# Patient Record
Sex: Female | Born: 1955 | Race: White | Hispanic: No
Health system: Southern US, Community
[De-identification: ages and names within clinical notes are randomized; demographics above are authoritative.]

---

## 1998-04-06 ENCOUNTER — Ambulatory Visit (HOSPITAL_COMMUNITY): Admission: RE | Admit: 1998-04-06 | Discharge: 1998-04-06 | Payer: Self-pay | Admitting: Obstetrics and Gynecology

## 1999-04-11 ENCOUNTER — Ambulatory Visit (HOSPITAL_COMMUNITY): Admission: RE | Admit: 1999-04-11 | Discharge: 1999-04-11 | Payer: Self-pay | Admitting: Obstetrics and Gynecology

## 1999-04-11 ENCOUNTER — Encounter: Payer: Self-pay | Admitting: Obstetrics and Gynecology

## 2000-04-18 ENCOUNTER — Encounter: Payer: Self-pay | Admitting: Obstetrics and Gynecology

## 2000-04-18 ENCOUNTER — Ambulatory Visit (HOSPITAL_COMMUNITY): Admission: RE | Admit: 2000-04-18 | Discharge: 2000-04-18 | Payer: Self-pay | Admitting: Obstetrics and Gynecology

## 2001-04-20 ENCOUNTER — Encounter: Payer: Self-pay | Admitting: Obstetrics and Gynecology

## 2001-04-20 ENCOUNTER — Ambulatory Visit (HOSPITAL_COMMUNITY): Admission: RE | Admit: 2001-04-20 | Discharge: 2001-04-20 | Payer: Self-pay | Admitting: Obstetrics and Gynecology

## 2002-07-22 ENCOUNTER — Encounter: Payer: Self-pay | Admitting: Obstetrics and Gynecology

## 2002-07-22 ENCOUNTER — Ambulatory Visit (HOSPITAL_COMMUNITY): Admission: RE | Admit: 2002-07-22 | Discharge: 2002-07-22 | Payer: Self-pay | Admitting: Obstetrics and Gynecology

## 2003-09-08 ENCOUNTER — Encounter: Payer: Self-pay | Admitting: Obstetrics and Gynecology

## 2003-09-08 ENCOUNTER — Ambulatory Visit (HOSPITAL_COMMUNITY): Admission: RE | Admit: 2003-09-08 | Discharge: 2003-09-08 | Payer: Self-pay | Admitting: Obstetrics and Gynecology

## 2004-10-22 ENCOUNTER — Ambulatory Visit (HOSPITAL_COMMUNITY): Admission: RE | Admit: 2004-10-22 | Discharge: 2004-10-22 | Payer: Self-pay | Admitting: Obstetrics and Gynecology

## 2005-11-19 ENCOUNTER — Ambulatory Visit (HOSPITAL_COMMUNITY): Admission: RE | Admit: 2005-11-19 | Discharge: 2005-11-19 | Payer: Self-pay | Admitting: Obstetrics and Gynecology

## 2006-11-20 ENCOUNTER — Ambulatory Visit (HOSPITAL_COMMUNITY): Admission: RE | Admit: 2006-11-20 | Discharge: 2006-11-20 | Payer: Self-pay | Admitting: Obstetrics and Gynecology

## 2007-11-25 ENCOUNTER — Ambulatory Visit (HOSPITAL_COMMUNITY): Admission: RE | Admit: 2007-11-25 | Discharge: 2007-11-25 | Payer: Self-pay | Admitting: Obstetrics and Gynecology

## 2008-11-25 ENCOUNTER — Ambulatory Visit (HOSPITAL_COMMUNITY): Admission: RE | Admit: 2008-11-25 | Discharge: 2008-11-25 | Payer: Self-pay | Admitting: Obstetrics and Gynecology

## 2009-11-27 ENCOUNTER — Ambulatory Visit (HOSPITAL_COMMUNITY): Admission: RE | Admit: 2009-11-27 | Discharge: 2009-11-27 | Payer: Self-pay | Admitting: Obstetrics and Gynecology

## 2010-11-29 ENCOUNTER — Ambulatory Visit (HOSPITAL_COMMUNITY)
Admission: RE | Admit: 2010-11-29 | Discharge: 2010-11-29 | Payer: Self-pay | Source: Home / Self Care | Attending: Obstetrics and Gynecology | Admitting: Obstetrics and Gynecology

## 2010-12-14 ENCOUNTER — Encounter
Admission: RE | Admit: 2010-12-14 | Discharge: 2010-12-14 | Payer: Self-pay | Source: Home / Self Care | Attending: Obstetrics and Gynecology | Admitting: Obstetrics and Gynecology

## 2011-11-25 ENCOUNTER — Other Ambulatory Visit (HOSPITAL_COMMUNITY): Payer: Self-pay | Admitting: Obstetrics and Gynecology

## 2011-11-25 DIAGNOSIS — Z1231 Encounter for screening mammogram for malignant neoplasm of breast: Secondary | ICD-10-CM

## 2011-12-30 ENCOUNTER — Ambulatory Visit (HOSPITAL_COMMUNITY): Payer: Self-pay

## 2012-01-28 ENCOUNTER — Ambulatory Visit (HOSPITAL_COMMUNITY)
Admission: RE | Admit: 2012-01-28 | Discharge: 2012-01-28 | Disposition: A | Discharge status: Home / Self Care | Payer: BC Managed Care – PPO | Source: Ambulatory Visit | Attending: Obstetrics and Gynecology | Admitting: Obstetrics and Gynecology

## 2012-01-28 DIAGNOSIS — Z1231 Encounter for screening mammogram for malignant neoplasm of breast: Secondary | ICD-10-CM

## 2012-12-22 ENCOUNTER — Other Ambulatory Visit (HOSPITAL_COMMUNITY): Payer: Self-pay | Admitting: Obstetrics and Gynecology

## 2012-12-22 DIAGNOSIS — Z1231 Encounter for screening mammogram for malignant neoplasm of breast: Secondary | ICD-10-CM

## 2013-01-28 ENCOUNTER — Ambulatory Visit (HOSPITAL_COMMUNITY): Payer: Managed Care, Other (non HMO)

## 2013-02-11 ENCOUNTER — Ambulatory Visit (HOSPITAL_COMMUNITY)
Admission: RE | Admit: 2013-02-11 | Discharge: 2013-02-11 | Disposition: A | Discharge status: Home / Self Care | Payer: BC Managed Care – PPO | Source: Ambulatory Visit | Attending: Obstetrics and Gynecology | Admitting: Obstetrics and Gynecology

## 2013-02-11 DIAGNOSIS — Z1231 Encounter for screening mammogram for malignant neoplasm of breast: Secondary | ICD-10-CM | POA: Insufficient documentation

## 2014-01-06 ENCOUNTER — Other Ambulatory Visit (HOSPITAL_COMMUNITY): Payer: Self-pay | Admitting: Obstetrics and Gynecology

## 2014-01-06 DIAGNOSIS — Z1231 Encounter for screening mammogram for malignant neoplasm of breast: Secondary | ICD-10-CM

## 2014-02-15 ENCOUNTER — Ambulatory Visit (HOSPITAL_COMMUNITY)
Admission: RE | Admit: 2014-02-15 | Discharge: 2014-02-15 | Disposition: A | Discharge status: Home / Self Care | Payer: BC Managed Care – PPO | Source: Ambulatory Visit | Attending: Obstetrics and Gynecology | Admitting: Obstetrics and Gynecology

## 2014-02-15 DIAGNOSIS — Z1231 Encounter for screening mammogram for malignant neoplasm of breast: Secondary | ICD-10-CM | POA: Insufficient documentation

## 2015-02-16 ENCOUNTER — Other Ambulatory Visit (HOSPITAL_COMMUNITY): Payer: Self-pay | Admitting: Obstetrics and Gynecology

## 2015-02-16 DIAGNOSIS — Z1231 Encounter for screening mammogram for malignant neoplasm of breast: Secondary | ICD-10-CM

## 2015-02-21 ENCOUNTER — Ambulatory Visit (HOSPITAL_COMMUNITY)
Admission: RE | Admit: 2015-02-21 | Discharge: 2015-02-21 | Disposition: A | Discharge status: Home / Self Care | Payer: BLUE CROSS/BLUE SHIELD | Source: Ambulatory Visit | Attending: Obstetrics and Gynecology | Admitting: Obstetrics and Gynecology

## 2015-02-21 DIAGNOSIS — Z1231 Encounter for screening mammogram for malignant neoplasm of breast: Secondary | ICD-10-CM | POA: Diagnosis not present

## 2016-02-23 ENCOUNTER — Other Ambulatory Visit: Payer: Self-pay

## 2016-02-23 DIAGNOSIS — Z1231 Encounter for screening mammogram for malignant neoplasm of breast: Secondary | ICD-10-CM

## 2016-03-07 ENCOUNTER — Ambulatory Visit
Admission: RE | Admit: 2016-03-07 | Discharge: 2016-03-07 | Disposition: A | Discharge status: Home / Self Care | Payer: BLUE CROSS/BLUE SHIELD | Source: Ambulatory Visit

## 2016-03-07 DIAGNOSIS — Z1231 Encounter for screening mammogram for malignant neoplasm of breast: Secondary | ICD-10-CM

## 2017-02-17 ENCOUNTER — Other Ambulatory Visit: Payer: Self-pay | Admitting: Obstetrics and Gynecology

## 2017-02-17 DIAGNOSIS — Z1231 Encounter for screening mammogram for malignant neoplasm of breast: Secondary | ICD-10-CM

## 2017-03-31 ENCOUNTER — Ambulatory Visit
Admission: RE | Admit: 2017-03-31 | Discharge: 2017-03-31 | Disposition: A | Discharge status: Home / Self Care | Payer: BLUE CROSS/BLUE SHIELD | Source: Ambulatory Visit | Attending: Obstetrics and Gynecology | Admitting: Obstetrics and Gynecology

## 2017-03-31 DIAGNOSIS — Z1231 Encounter for screening mammogram for malignant neoplasm of breast: Secondary | ICD-10-CM

## 2018-02-24 ENCOUNTER — Other Ambulatory Visit: Payer: Self-pay | Admitting: Obstetrics and Gynecology

## 2018-02-24 DIAGNOSIS — Z1231 Encounter for screening mammogram for malignant neoplasm of breast: Secondary | ICD-10-CM

## 2018-04-01 ENCOUNTER — Ambulatory Visit
Admission: RE | Admit: 2018-04-01 | Discharge: 2018-04-01 | Disposition: A | Discharge status: Home / Self Care | Payer: BLUE CROSS/BLUE SHIELD | Source: Ambulatory Visit | Attending: Obstetrics and Gynecology | Admitting: Obstetrics and Gynecology

## 2018-04-01 DIAGNOSIS — Z1231 Encounter for screening mammogram for malignant neoplasm of breast: Secondary | ICD-10-CM

## 2019-02-26 ENCOUNTER — Other Ambulatory Visit: Payer: Self-pay | Admitting: Obstetrics and Gynecology

## 2019-02-26 DIAGNOSIS — Z1231 Encounter for screening mammogram for malignant neoplasm of breast: Secondary | ICD-10-CM

## 2019-04-06 ENCOUNTER — Ambulatory Visit: Payer: BLUE CROSS/BLUE SHIELD

## 2019-05-25 ENCOUNTER — Ambulatory Visit
Admission: RE | Admit: 2019-05-25 | Discharge: 2019-05-25 | Disposition: A | Discharge status: Home / Self Care | Payer: BC Managed Care – PPO | Source: Ambulatory Visit | Attending: Obstetrics and Gynecology | Admitting: Obstetrics and Gynecology

## 2019-05-25 ENCOUNTER — Other Ambulatory Visit: Payer: Self-pay

## 2019-05-25 DIAGNOSIS — Z1231 Encounter for screening mammogram for malignant neoplasm of breast: Secondary | ICD-10-CM

## 2020-03-30 ENCOUNTER — Ambulatory Visit: Payer: Self-pay | Attending: Internal Medicine

## 2020-03-30 DIAGNOSIS — Z23 Encounter for immunization: Secondary | ICD-10-CM

## 2020-03-30 NOTE — Progress Notes (Signed)
   Covid-19 Vaccination Clinic  Name:  Brooke Hubbard    MRN: [id number] DOB: 26-May-1956  03/30/2020  [name] was observed post Covid-19 immunization for 15 minutes without incident. She was provided with Vaccine Information Sheet and instruction to access the V-Safe system.   [name] was instructed to call 911 with any severe reactions post vaccine: Marland Kitchen Difficulty breathing  . Swelling of face and throat  . A fast heartbeat  . A bad rash all over body  . Dizziness and weakness   Immunizations Administered    Name Date Dose VIS Date Route   Pfizer COVID-19 Vaccine 03/30/2020 10:50 AM 0.3 mL 11/26/2019 Intramuscular   Manufacturer: [employer], [employer]   Lot: [id number]   NDC: [address]-6

## 2020-04-17 ENCOUNTER — Other Ambulatory Visit: Payer: Self-pay | Admitting: Obstetrics and Gynecology

## 2020-04-17 DIAGNOSIS — Z1231 Encounter for screening mammogram for malignant neoplasm of breast: Secondary | ICD-10-CM

## 2020-04-26 ENCOUNTER — Ambulatory Visit: Payer: Medicare Other | Attending: Internal Medicine

## 2020-04-26 DIAGNOSIS — Z23 Encounter for immunization: Secondary | ICD-10-CM

## 2020-04-26 NOTE — Progress Notes (Signed)
   Covid-19 Vaccination Clinic  Name:  LIANNI KANAAN    MRN: [id number] DOB: 1956-01-14  04/26/2020  Ms. Burston was observed post Covid-19 immunization for 15 minutes without incident. She was provided with Vaccine Information Sheet and instruction to access the V-Safe system.   Ms. Haltiwanger was instructed to call 911 with any severe reactions post vaccine: Marland Kitchen Difficulty breathing  . Swelling of face and throat  . A fast heartbeat  . A bad rash all over body  . Dizziness and weakness   Immunizations Administered    Name Date Dose VIS Date Route   Pfizer COVID-19 Vaccine 04/26/2020  8:09 AM 0.3 mL 02/09/2019 Intramuscular   Manufacturer: [employer], [employer]   Lot: [id number]   NDC: [address]-1

## 2020-06-08 ENCOUNTER — Other Ambulatory Visit: Payer: Self-pay

## 2020-06-08 ENCOUNTER — Ambulatory Visit
Admission: RE | Admit: 2020-06-08 | Discharge: 2020-06-08 | Disposition: A | Discharge status: Home / Self Care | Payer: Medicare Other | Source: Ambulatory Visit | Attending: Obstetrics and Gynecology | Admitting: Obstetrics and Gynecology

## 2020-06-08 DIAGNOSIS — Z1231 Encounter for screening mammogram for malignant neoplasm of breast: Secondary | ICD-10-CM

## 2021-04-30 ENCOUNTER — Other Ambulatory Visit: Payer: Self-pay | Admitting: Student

## 2021-04-30 ENCOUNTER — Other Ambulatory Visit: Payer: Self-pay | Admitting: Family Medicine

## 2021-04-30 DIAGNOSIS — Z1231 Encounter for screening mammogram for malignant neoplasm of breast: Secondary | ICD-10-CM

## 2021-06-25 ENCOUNTER — Other Ambulatory Visit: Payer: Self-pay

## 2021-06-25 ENCOUNTER — Ambulatory Visit
Admission: RE | Admit: 2021-06-25 | Discharge: 2021-06-25 | Disposition: A | Discharge status: Home / Self Care | Payer: Medicare Other | Source: Ambulatory Visit | Attending: Family Medicine | Admitting: Family Medicine

## 2021-06-25 DIAGNOSIS — Z1231 Encounter for screening mammogram for malignant neoplasm of breast: Secondary | ICD-10-CM

## 2022-03-10 IMAGING — MG MM DIGITAL SCREENING BILAT W/ TOMO AND CAD
8 series · 8 of 24 positions shown · non-contrast
Comparison: Previous exam(s).

CLINICAL DATA: Screening.

EXAM:
DIGITAL SCREENING BILATERAL MAMMOGRAM WITH TOMOSYNTHESIS AND CAD
TECHNIQUE: Bilateral screening digital craniocaudal and mediolateral oblique
mammograms were obtained. Bilateral screening digital breast
tomosynthesis was performed. The images were evaluated with
computer-aided detection.

[R CC synth-2D]
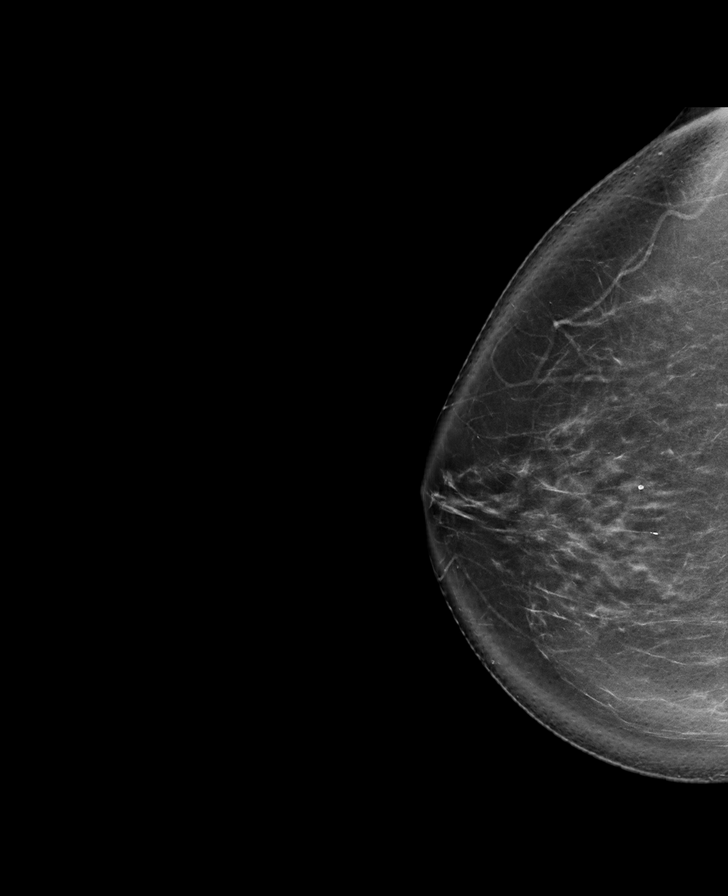

[L CC synth-2D]
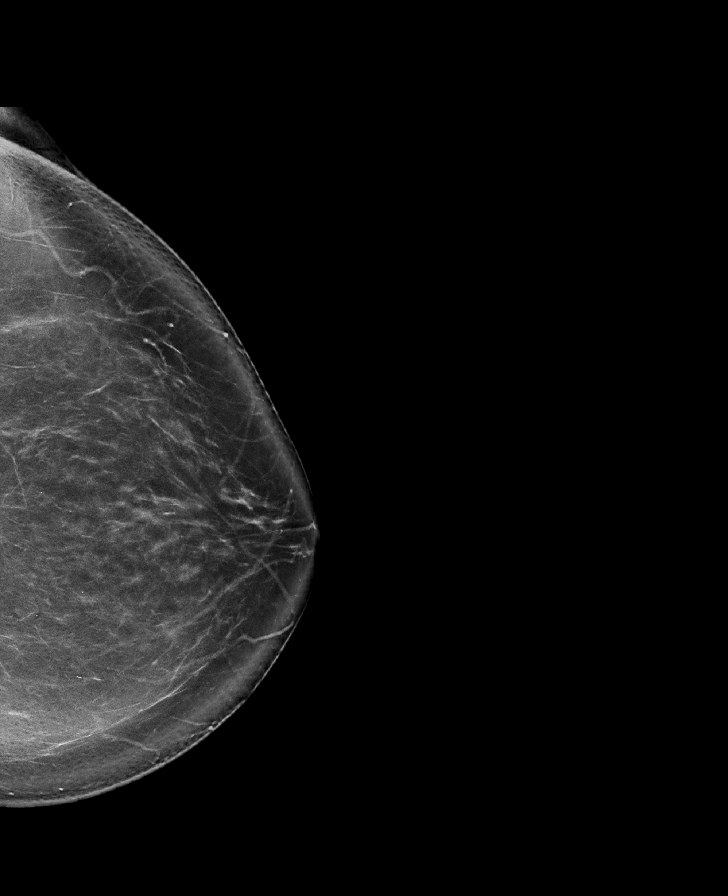

[R MLO synth-2D]
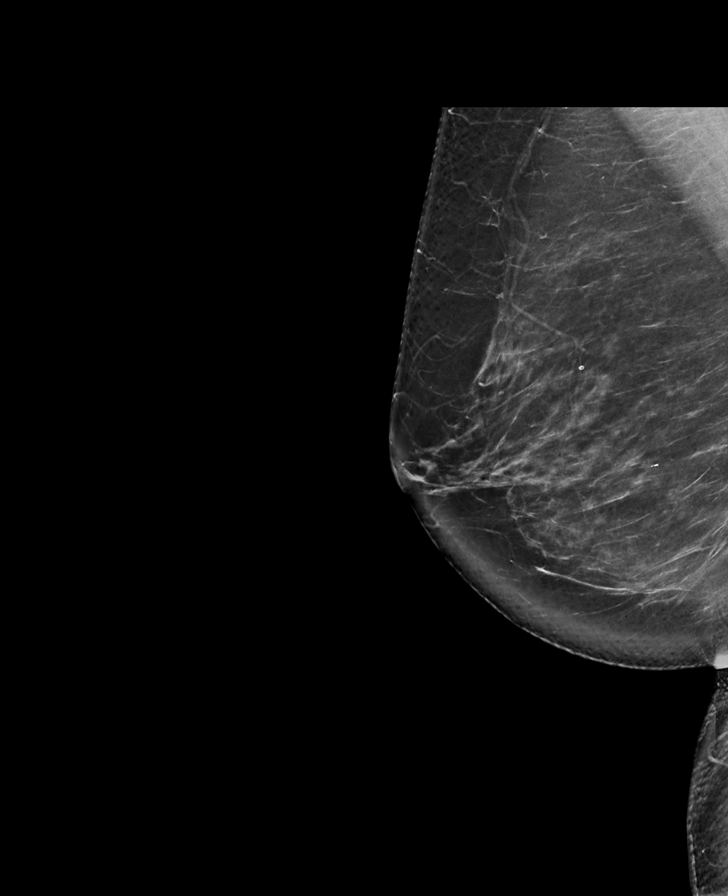

[L MLO synth-2D]
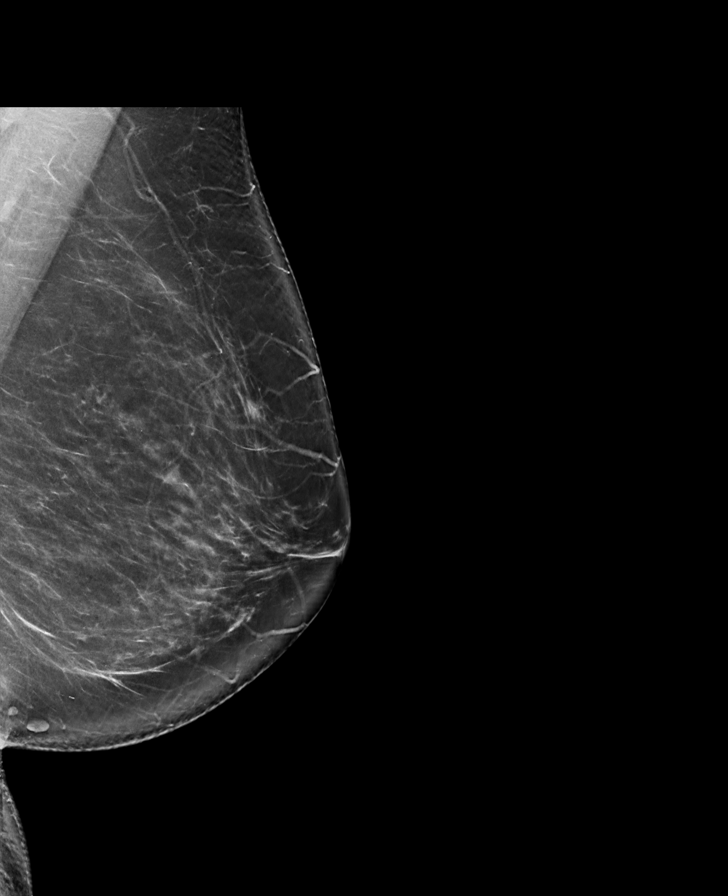

[R CC tomo · tomo slice 49/96.0]
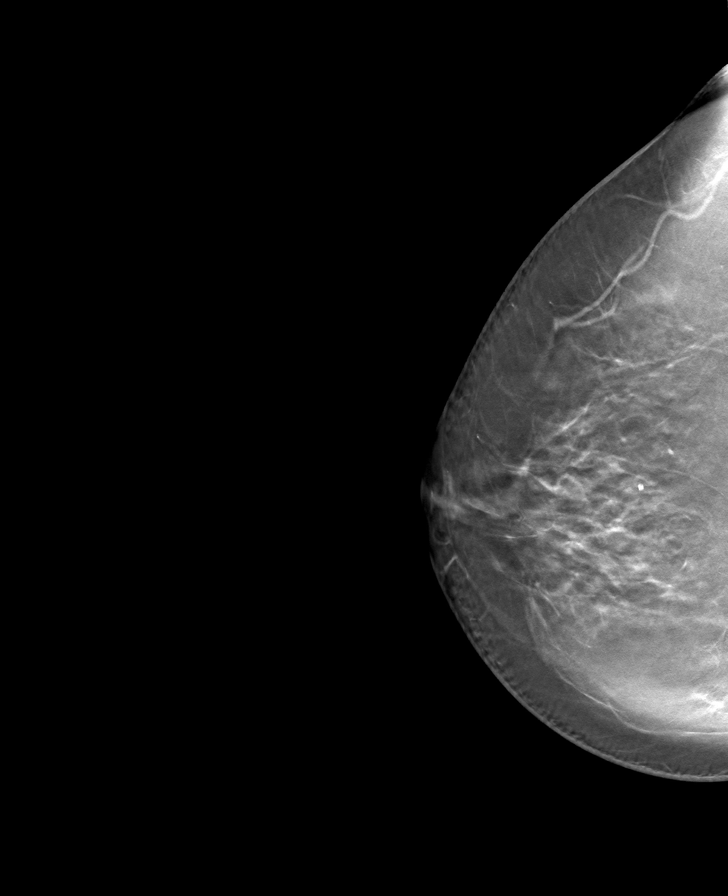

[L CC tomo · tomo slice 50/99.0]
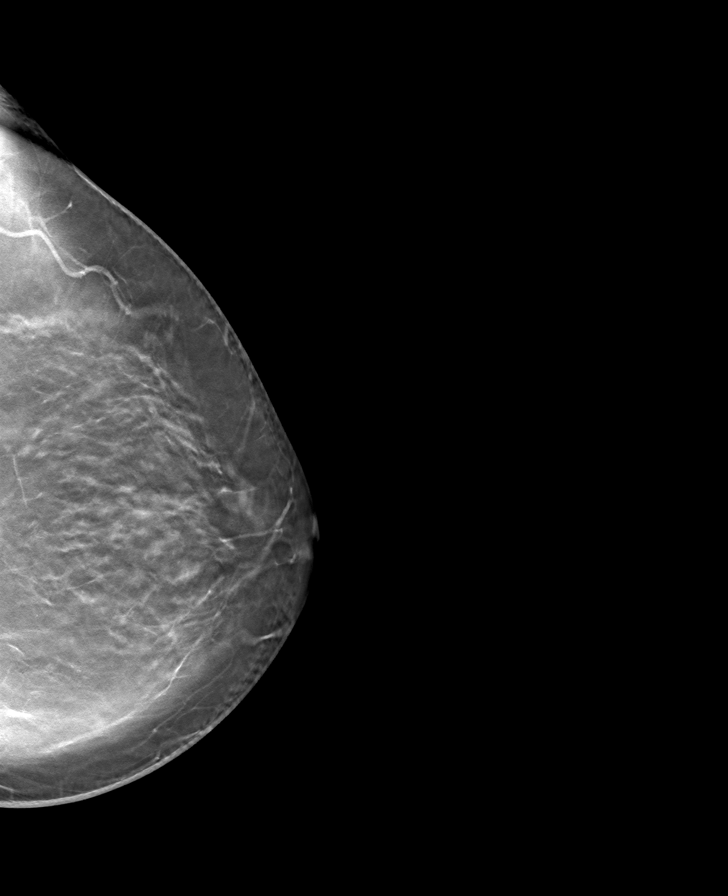

[R MLO tomo · tomo slice 51/101.0]
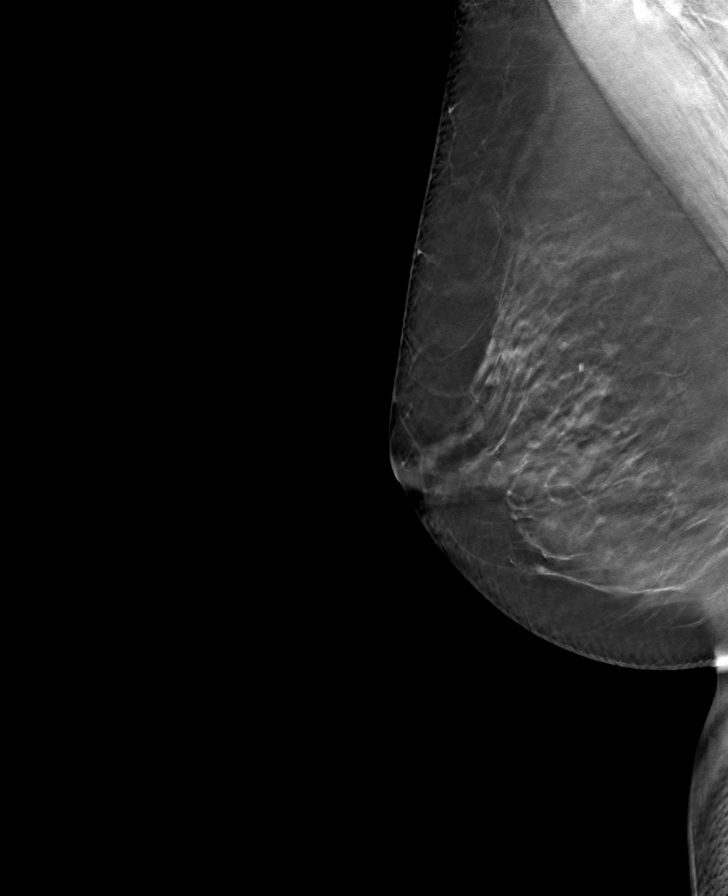

[L MLO tomo · tomo slice 49/98.0]
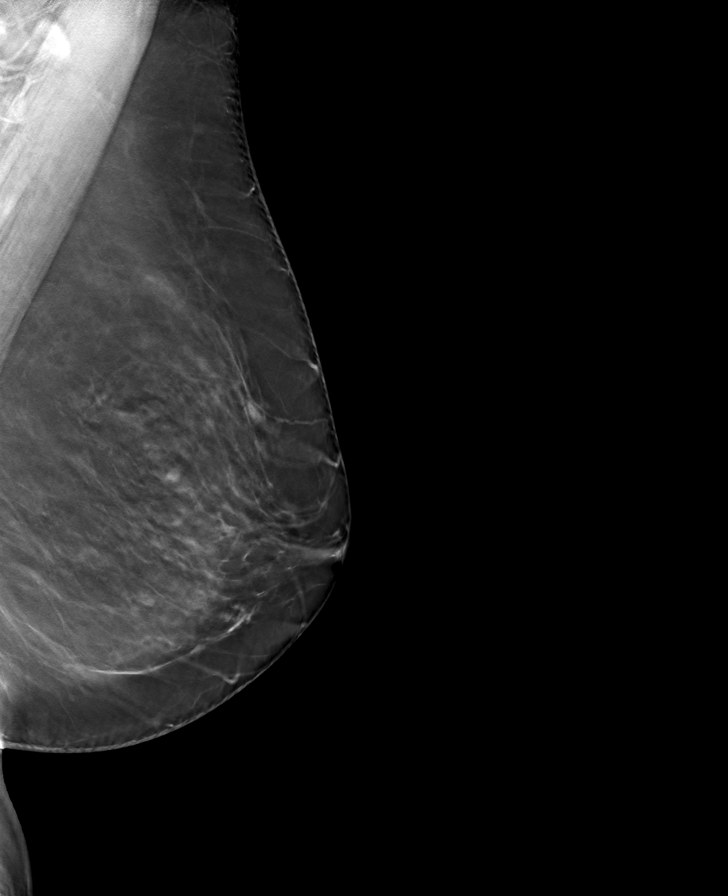

[8 of 24 positions shown; findings below may reference images not displayed]

ACR Breast Density Category b: There are scattered areas of
fibroglandular density.
FINDINGS: There are no findings suspicious for malignancy.
IMPRESSION: No mammographic evidence of malignancy. A result letter of this
screening mammogram will be mailed directly to the patient.

RECOMMENDATION:
Screening mammogram in one year. (Code:51-O-LD2)

BI-RADS CATEGORY  1: Negative.

## 2022-05-17 ENCOUNTER — Other Ambulatory Visit: Payer: Self-pay | Admitting: Family Medicine

## 2022-05-17 DIAGNOSIS — Z1231 Encounter for screening mammogram for malignant neoplasm of breast: Secondary | ICD-10-CM

## 2022-06-27 ENCOUNTER — Ambulatory Visit
Admission: RE | Admit: 2022-06-27 | Discharge: 2022-06-27 | Disposition: A | Discharge status: Home / Self Care | Payer: Medicare Other | Source: Ambulatory Visit | Attending: Family Medicine | Admitting: Family Medicine

## 2022-06-27 DIAGNOSIS — Z1231 Encounter for screening mammogram for malignant neoplasm of breast: Secondary | ICD-10-CM

## 2023-05-19 ENCOUNTER — Other Ambulatory Visit: Payer: Self-pay | Admitting: Family Medicine

## 2023-05-19 DIAGNOSIS — Z1231 Encounter for screening mammogram for malignant neoplasm of breast: Secondary | ICD-10-CM

## 2023-06-30 ENCOUNTER — Ambulatory Visit: Admission: RE | Admit: 2023-06-30 | Payer: Medicare Other | Source: Ambulatory Visit

## 2023-06-30 DIAGNOSIS — Z1231 Encounter for screening mammogram for malignant neoplasm of breast: Secondary | ICD-10-CM

## 2024-05-31 ENCOUNTER — Other Ambulatory Visit: Payer: Self-pay | Admitting: Family Medicine

## 2024-05-31 DIAGNOSIS — Z1231 Encounter for screening mammogram for malignant neoplasm of breast: Secondary | ICD-10-CM

## 2024-06-30 ENCOUNTER — Ambulatory Visit

## 2024-07-09 ENCOUNTER — Ambulatory Visit
Admission: RE | Admit: 2024-07-09 | Discharge: 2024-07-09 | Disposition: A | Discharge status: Home / Self Care | Source: Ambulatory Visit | Attending: Family Medicine | Admitting: Family Medicine

## 2024-07-09 DIAGNOSIS — Z1231 Encounter for screening mammogram for malignant neoplasm of breast: Secondary | ICD-10-CM
